# Patient Record
Sex: Male | Born: 2005 | Race: White | Hispanic: Yes | Marital: Single | Smoking: Never smoker
Health system: Southern US, Community
[De-identification: ages and names within clinical notes are randomized; demographics above are authoritative.]

## PROBLEM LIST (undated history)

## (undated) DIAGNOSIS — M79672 Pain in left foot: Principal | ICD-10-CM

## (undated) DIAGNOSIS — M79671 Pain in right foot: Principal | ICD-10-CM

## (undated) HISTORY — DX: Pain in right foot: M79.671

## (undated) HISTORY — DX: Pain in left foot: M79.672

---
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
## 2024-01-29 DIAGNOSIS — Z0100 Encounter for examination of eyes and vision without abnormal findings: Secondary | ICD-10-CM | POA: Diagnosis not present

## 2024-01-29 DIAGNOSIS — Z7189 Other specified counseling: Secondary | ICD-10-CM | POA: Diagnosis not present

## 2024-01-29 DIAGNOSIS — Z0000 Encounter for general adult medical examination without abnormal findings: Secondary | ICD-10-CM | POA: Diagnosis not present

## 2024-01-29 DIAGNOSIS — Z09 Encounter for follow-up examination after completed treatment for conditions other than malignant neoplasm: Secondary | ICD-10-CM | POA: Diagnosis not present

## 2024-01-29 DIAGNOSIS — Z68.41 Body mass index (BMI) pediatric, greater than or equal to 95th percentile for age: Secondary | ICD-10-CM | POA: Diagnosis not present

## 2024-02-02 ENCOUNTER — Ambulatory Visit

## 2024-03-11 ENCOUNTER — Other Ambulatory Visit: Payer: Self-pay | Admitting: Pediatrics

## 2024-03-11 DIAGNOSIS — J301 Allergic rhinitis due to pollen: Secondary | ICD-10-CM

## 2024-03-11 NOTE — Telephone Encounter (Signed)
 Refill request received for claritin   Last seen 02/2023 Last seen for this problem, allergies 02/2023  Plan to switch to another care provider.  He can call that  clinic to see if they have ever ordered Claritin  and is also Available without a prescription  Refill not approved.

## 2024-07-19 ENCOUNTER — Encounter: Payer: Self-pay | Admitting: Pediatrics

## 2024-07-19 ENCOUNTER — Ambulatory Visit: Admitting: Pediatrics

## 2024-07-19 ENCOUNTER — Other Ambulatory Visit (HOSPITAL_COMMUNITY)
Admission: RE | Admit: 2024-07-19 | Discharge: 2024-07-19 | Disposition: A | Discharge status: Home / Self Care | Source: Ambulatory Visit | Attending: Pediatrics | Admitting: Pediatrics

## 2024-07-19 VITALS — BP 128/62 | HR 88 | Ht 68.7 in | Wt 251.2 lb

## 2024-07-19 DIAGNOSIS — E7800 Pure hypercholesterolemia, unspecified: Secondary | ICD-10-CM

## 2024-07-19 DIAGNOSIS — Z114 Encounter for screening for human immunodeficiency virus [HIV]: Secondary | ICD-10-CM | POA: Diagnosis not present

## 2024-07-19 DIAGNOSIS — Z113 Encounter for screening for infections with a predominantly sexual mode of transmission: Secondary | ICD-10-CM | POA: Diagnosis not present

## 2024-07-19 DIAGNOSIS — E559 Vitamin D deficiency, unspecified: Secondary | ICD-10-CM

## 2024-07-19 DIAGNOSIS — Z1339 Encounter for screening examination for other mental health and behavioral disorders: Secondary | ICD-10-CM | POA: Diagnosis not present

## 2024-07-19 DIAGNOSIS — Z1331 Encounter for screening for depression: Secondary | ICD-10-CM

## 2024-07-19 DIAGNOSIS — Z68.41 Body mass index (BMI) pediatric, greater than or equal to 95th percentile for age: Secondary | ICD-10-CM | POA: Diagnosis not present

## 2024-07-19 DIAGNOSIS — E669 Obesity, unspecified: Secondary | ICD-10-CM

## 2024-07-19 DIAGNOSIS — Z0000 Encounter for general adult medical examination without abnormal findings: Secondary | ICD-10-CM

## 2024-07-19 DIAGNOSIS — Z0001 Encounter for general adult medical examination with abnormal findings: Secondary | ICD-10-CM

## 2024-07-19 LAB — POCT RAPID HIV: Rapid HIV, POC: NEGATIVE

## 2024-07-19 NOTE — Patient Instructions (Addendum)
 Overall health looks good with good blood pressure reading! Your weight has been fairly stable over the past year and this is good. Continue to be physically active and try adding some stair climbing to help strengthen muscles supporting your knees; try rowing machine or yoga to help with spine flexion.  Next full check up in 1 year. Option: Can change to our [facility]olescent Clinic or Family Med Doctor of your choice.  Labs from today will be completed within the next few days and I will reach out to you in MyChart or by phone.

## 2024-07-19 NOTE — Progress Notes (Signed)
 Adolescent Well Care Visit Michael Rasmussen is a 18 y.o. male who is here for well care.    PCP:  Landrum Lapine, MD   History was provided by the patient.  Confidentiality was discussed with the patient and, if applicable, with caregiver as well. Patient's personal or confidential phone number: [phone]   Current Issues: Current concerns include he is doing well.   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  no Stressors of note: no He has his driver's license and his own car.   Confidential Social History: Tobacco?  no Secondhand smoke exposure?  no Drugs/ETOH?  no  Sexually Active?  yes   Pregnancy Prevention: not currently involved sexually Dating  same friend x 3 to 4 years  Safe at home, in school & in relationships?  Yes Safe to self?  Yes   Screenings: Patient has a dental home: Dr Jerona - went recently  The patient completed the Rapid Assessment for Adolescent Preventive Services screening questionnaire and the following topics were identified as risk factors and discussed: exercise  In addition, the following topics were discussed as part of anticipatory guidance healthy eating, exercise, condom use, birth control, screen time, and sleep.  PHQ-9 completed and results indicated low risk with score of 0; no self-harm ideation. Flowsheet Row Office Visit from 07/19/2024  in [place] and [facility]  PHQ-2 Total Score 0     Physical Exam:  Vitals:   07/19/24 1512  BP: 128/62  Pulse: 88  SpO2: 98%  Weight: 251 lb 3.2 oz (113.9 kg)  Height: 5' 8.7 (1.745 m)   Wt Readings from Last 3 Encounters:  07/19/24 251 lb 3.2 oz (113.9 kg) (>99%, Z= 2.45)*  03/16/23 (!) 248 lb 9.6 oz (112.8 kg) (>99%, Z= 2.53)*  11/24/22 (!) 240 lb 6.4 oz (109 kg) (>99%, Z= 2.46)*   * Growth percentiles are based on CDC (Boys, 2-20 Years) data.    BP 128/62 (BP Location: Right Arm, Patient Position: Sitting, Cuff Size: Large)   Pulse 88   Ht 5' 8.7 (1.745 m)   Wt 251 lb 3.2 oz (113.9 kg)   SpO2 98%   BMI 37.42 kg/m  Body mass index: body mass index is 37.42 kg/m. Blood pressure %iles are not available for patients who are 18 years or older.  Hearing Screening   500Hz  1000Hz  2000Hz  4000Hz   Right ear 20 20 20 20   Left ear 20 20 20 20    Vision Screening   Right eye Left eye Both eyes  Without correction 20/30 20/16 20/16   With correction       General Appearance:   alert, oriented, no acute distress, well nourished, and pleasant young man  HENT: Normocephalic, no obvious abnormality, conjunctiva clear  Mouth:   Normal appearing teeth, no obvious discoloration, dental caries, or dental caps  Neck:   Supple; thyroid: no enlargement, symmetric,  no tenderness/mass/nodules  Chest Normal male with increased adiposity   Lungs:   Clear to auscultation bilaterally, normal work of breathing  Heart:   Regular rate and rhythm, S1 and S2 normal, no murmurs;   Abdomen:   Soft, non-tender, no mass, or organomegaly  GU genitalia not examined  Musculoskeletal:   Tone and strength strong and symmetrical, all extremities.  Mild pop and crepitus in the right knee on manipulation; no stated pain and there is no fluid or swelling.  Limited forward flexion of spine               Lymphatic:   No cervical adenopathy  Skin/Hair/Nails:   Skin warm, dry  and intact, no rashes, no bruises or petechiae. Neatly trimmed bead.  No significant acne lesions He has acanthosis at the back of his neck.  Striae noted on body  Neurologic:   Strength, gait, and coordination normal and age-appropriate   Results for orders placed or performed in visit on 07/19/24 (from the past 48 hours)  POCT Rapid HIV     Status: Normal   Collection Time: 07/19/24  3:41 PM  Result Value Ref Range   Rapid HIV, POC Negative      Assessment and Plan:   1. Encounter for general adult medical examination without abnormal findings (Primary) Hearing screening result:normal Vision screening result: normal Age appropriate anticipatory guidance provided.  Discussed more calcium in diet with maybe milk at breakfast, yogurt as pm snack.  2. Obesity peds (BMI >=95 percentile) BMI is elevated for age; however, his weight has remained stable within about 3 pounds for the past 16 months. He has a physically active job and states other exercise when possible; works on healthy eating habits. I encouraged his continued healthy lifestyle habits.  Labs done today due to increase risk for obesity related illness and previous history of abnormal labs.  Will contact with results. - Comprehensive metabolic panel with GFR - VITAMIN D  25 Hydroxy (Vit-D Deficiency, Fractures) - Hemoglobin A1c - Lipid panel  3. Screening for human immunodeficiency virus Negative result today; will repeat in 1 year and prn. - POCT Rapid HIV  4. Screening examination for venereal disease No prior abnormal values.  Discussed condom use.  Will notify pt if positive. If negative, repeat in 1 year and prn. - Urine cytology ancillary only  5. Vitamin D  deficiency Low value 2 years ago of 18 and supplement provided.  Has not continued with supplement. Does work outdoors.  Will check value and alert if supplementation needed. Already counseled to add D-fortified milk to diet. - VITAMIN D  25 Hydroxy (Vit-D  Deficiency, Fractures)  6. Elevated cholesterol Cholesterol mildly elevated when checked 2 years ago and low HDL. Anticipate HDL improved due to lifestyle; patient agreed to testing today and will contact with results. - Lipid panel   Advised on wellness visit annually. Offered option to continue here in Adolescent Clinic or change to [facility] Med provider of choice for wellness visit 2026. MyChart activation done.  Jon JINNY Bars, MD

## 2024-07-20 LAB — COMPREHENSIVE METABOLIC PANEL WITH GFR
AG Ratio: 1.4 (calc) (ref 1.0–2.5)
ALT: 30 U/L (ref 8–46)
AST: 22 U/L (ref 12–32)
Albumin: 4.2 g/dL (ref 3.6–5.1)
Alkaline phosphatase (APISO): 206 U/L — ABNORMAL HIGH (ref 46–169)
BUN: 8 mg/dL (ref 7–20)
CO2: 27 mmol/L (ref 20–32)
Calcium: 9 mg/dL (ref 8.9–10.4)
Chloride: 105 mmol/L (ref 98–110)
Creat: 0.62 mg/dL (ref 0.60–1.24)
Globulin: 2.9 g/dL (ref 2.1–3.5)
Glucose, Bld: 89 mg/dL (ref 65–139)
Potassium: 3.8 mmol/L (ref 3.8–5.1)
Sodium: 141 mmol/L (ref 135–146)
Total Bilirubin: 0.5 mg/dL (ref 0.2–1.1)
Total Protein: 7.1 g/dL (ref 6.3–8.2)
eGFR: 142 mL/min/1.73m2 (ref >=60)

## 2024-07-20 LAB — LIPID PANEL
Cholesterol: 175 mg/dL — ABNORMAL HIGH (ref <170)
HDL: 37 mg/dL — ABNORMAL LOW (ref >45)
LDL Cholesterol (Calc): 109 mg/dL (ref <110)
Non-HDL Cholesterol (Calc): 138 mg/dL — ABNORMAL HIGH (ref <120)
Total CHOL/HDL Ratio: 4.7 (calc) (ref <5.0)
Triglycerides: 173 mg/dL — ABNORMAL HIGH (ref <90)

## 2024-07-20 LAB — HEMOGLOBIN A1C
Hgb A1c MFr Bld: 5.4 % (ref <5.7)
Mean Plasma Glucose: 108 mg/dL
eAG (mmol/L): 6 mmol/L

## 2024-07-20 LAB — VITAMIN D 25 HYDROXY (VIT D DEFICIENCY, FRACTURES): Vit D, 25-Hydroxy: 20 ng/mL — ABNORMAL LOW (ref 30–100)

## 2024-07-23 LAB — URINE CYTOLOGY ANCILLARY ONLY
Chlamydia: NEGATIVE
Comment: NEGATIVE
Comment: NORMAL
Neisseria Gonorrhea: NEGATIVE

## 2024-08-09 ENCOUNTER — Ambulatory Visit: Payer: Self-pay | Admitting: Pediatrics

## 2024-08-09 ENCOUNTER — Encounter: Payer: Self-pay | Admitting: Pediatrics

## 2024-08-27 ENCOUNTER — Ambulatory Visit

## 2024-08-29 ENCOUNTER — Ambulatory Visit

## 2024-09-09 ENCOUNTER — Ambulatory Visit (INDEPENDENT_AMBULATORY_CARE_PROVIDER_SITE_OTHER)

## 2024-09-09 DIAGNOSIS — Z23 Encounter for immunization: Secondary | ICD-10-CM
